# Patient Record
Sex: Female | Born: 1985 | Race: White | Hispanic: No | Marital: Married | Smoking: Never smoker
Health system: Southern US, Community
[De-identification: ages and names within clinical notes are randomized; demographics above are authoritative.]

## PROBLEM LIST (undated history)

## (undated) DIAGNOSIS — N858 Other specified noninflammatory disorders of uterus: Secondary | ICD-10-CM

## (undated) DIAGNOSIS — E785 Hyperlipidemia, unspecified: Secondary | ICD-10-CM

## (undated) DIAGNOSIS — T7840XA Allergy, unspecified, initial encounter: Secondary | ICD-10-CM

## (undated) HISTORY — DX: Hyperlipidemia, unspecified: E78.5

## (undated) HISTORY — DX: Allergy, unspecified, initial encounter: T78.40XA

## (undated) HISTORY — PX: OTHER SURGICAL HISTORY: SHX169

---

## 2006-01-10 ENCOUNTER — Ambulatory Visit: Payer: Self-pay | Admitting: Family Medicine

## 2006-01-24 ENCOUNTER — Other Ambulatory Visit: Admission: RE | Admit: 2006-01-24 | Discharge: 2006-01-24 | Payer: Self-pay | Admitting: Family Medicine

## 2006-01-24 ENCOUNTER — Ambulatory Visit: Payer: Self-pay | Admitting: Family Medicine

## 2006-12-07 ENCOUNTER — Ambulatory Visit: Payer: Self-pay | Admitting: Family Medicine

## 2007-05-04 ENCOUNTER — Ambulatory Visit: Payer: Self-pay | Admitting: Family Medicine

## 2007-05-04 ENCOUNTER — Other Ambulatory Visit: Admission: RE | Admit: 2007-05-04 | Discharge: 2007-05-04 | Payer: Self-pay | Admitting: Family Medicine

## 2008-01-04 ENCOUNTER — Ambulatory Visit: Payer: Self-pay | Admitting: Family Medicine

## 2008-11-07 ENCOUNTER — Ambulatory Visit: Payer: Self-pay | Admitting: Family Medicine

## 2009-01-01 ENCOUNTER — Ambulatory Visit: Payer: Self-pay | Admitting: Family Medicine

## 2009-04-02 ENCOUNTER — Ambulatory Visit: Payer: Self-pay | Admitting: Family Medicine

## 2009-04-22 ENCOUNTER — Ambulatory Visit: Payer: Self-pay | Admitting: Family Medicine

## 2009-04-24 ENCOUNTER — Encounter: Admission: RE | Admit: 2009-04-24 | Discharge: 2009-04-24 | Payer: Self-pay | Admitting: Family Medicine

## 2009-11-05 ENCOUNTER — Ambulatory Visit: Payer: Self-pay | Admitting: Family Medicine

## 2010-09-17 ENCOUNTER — Encounter: Payer: Self-pay | Admitting: Family Medicine

## 2010-11-20 IMAGING — CR DG CHEST 2V
2 series · 2 of 2 positions shown · non-contrast
Comparison: None

CLINICAL DATA: Elevated white blood cell count.

CHEST - 2 VIEW

[view not recorded (1 of 2)]
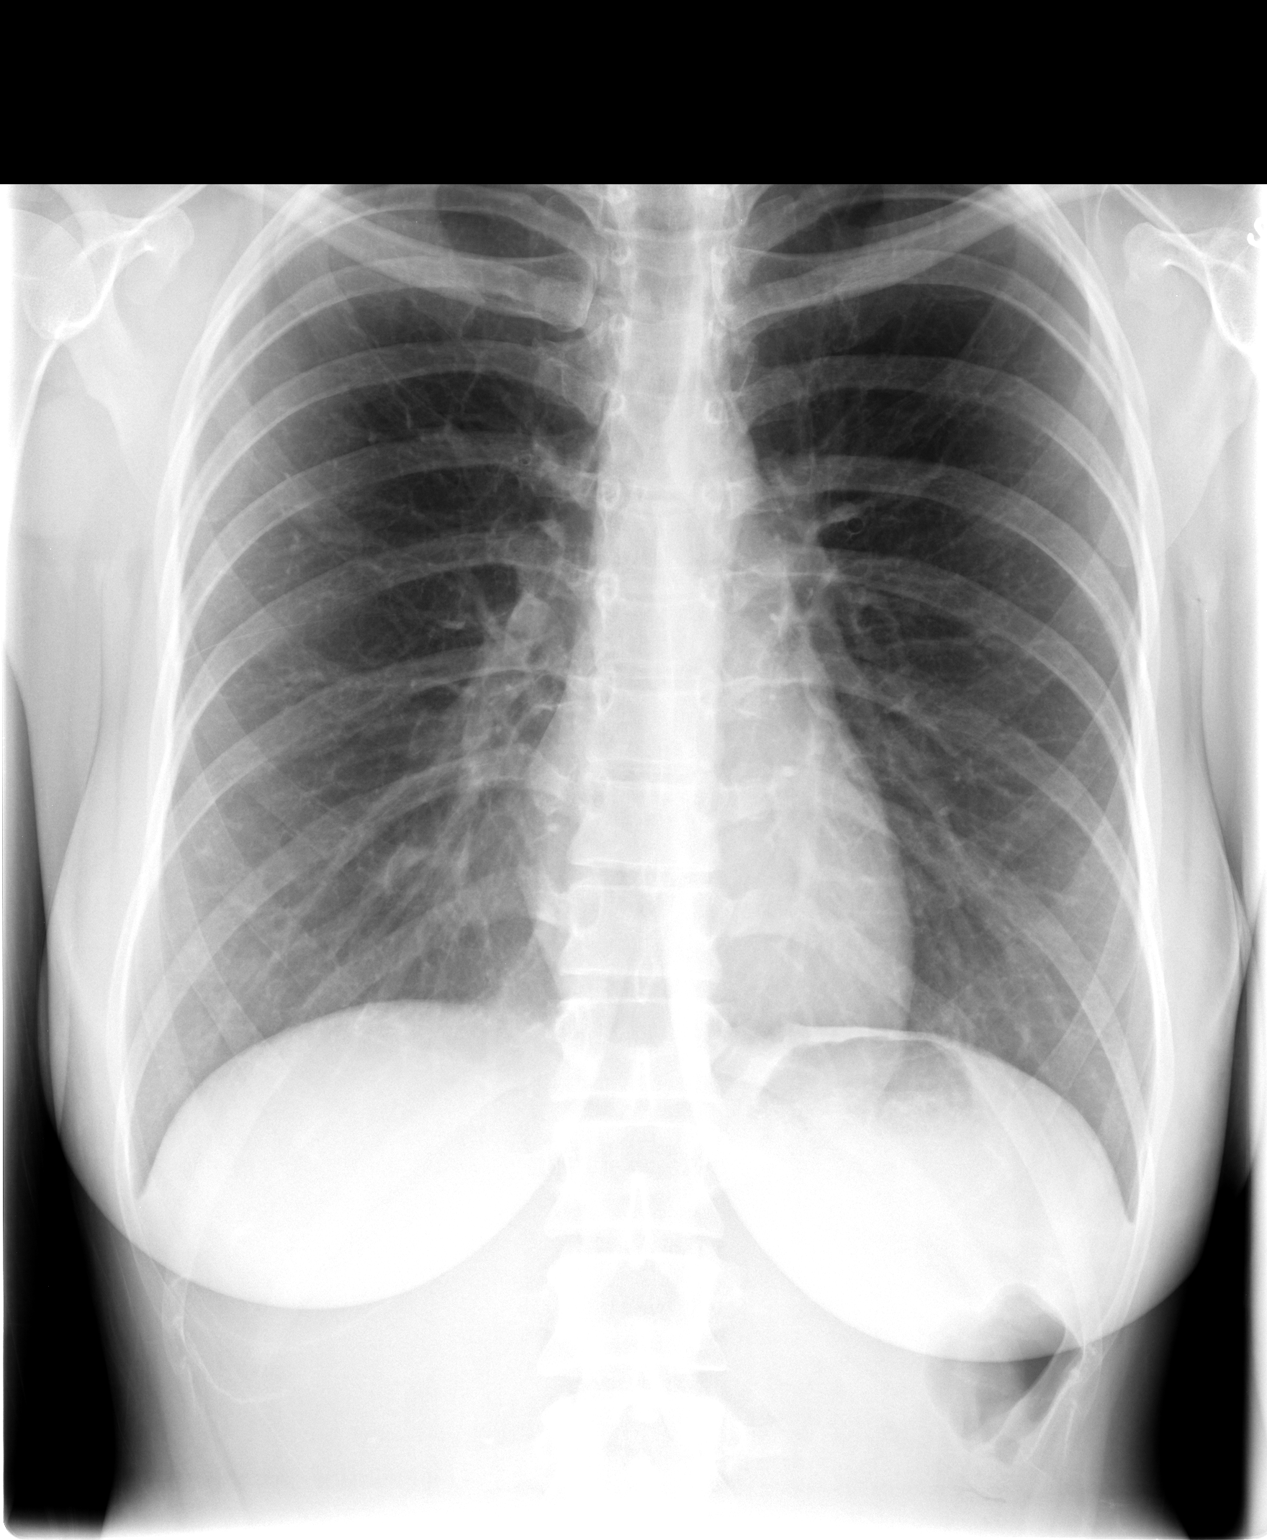

[view not recorded (2 of 2)]
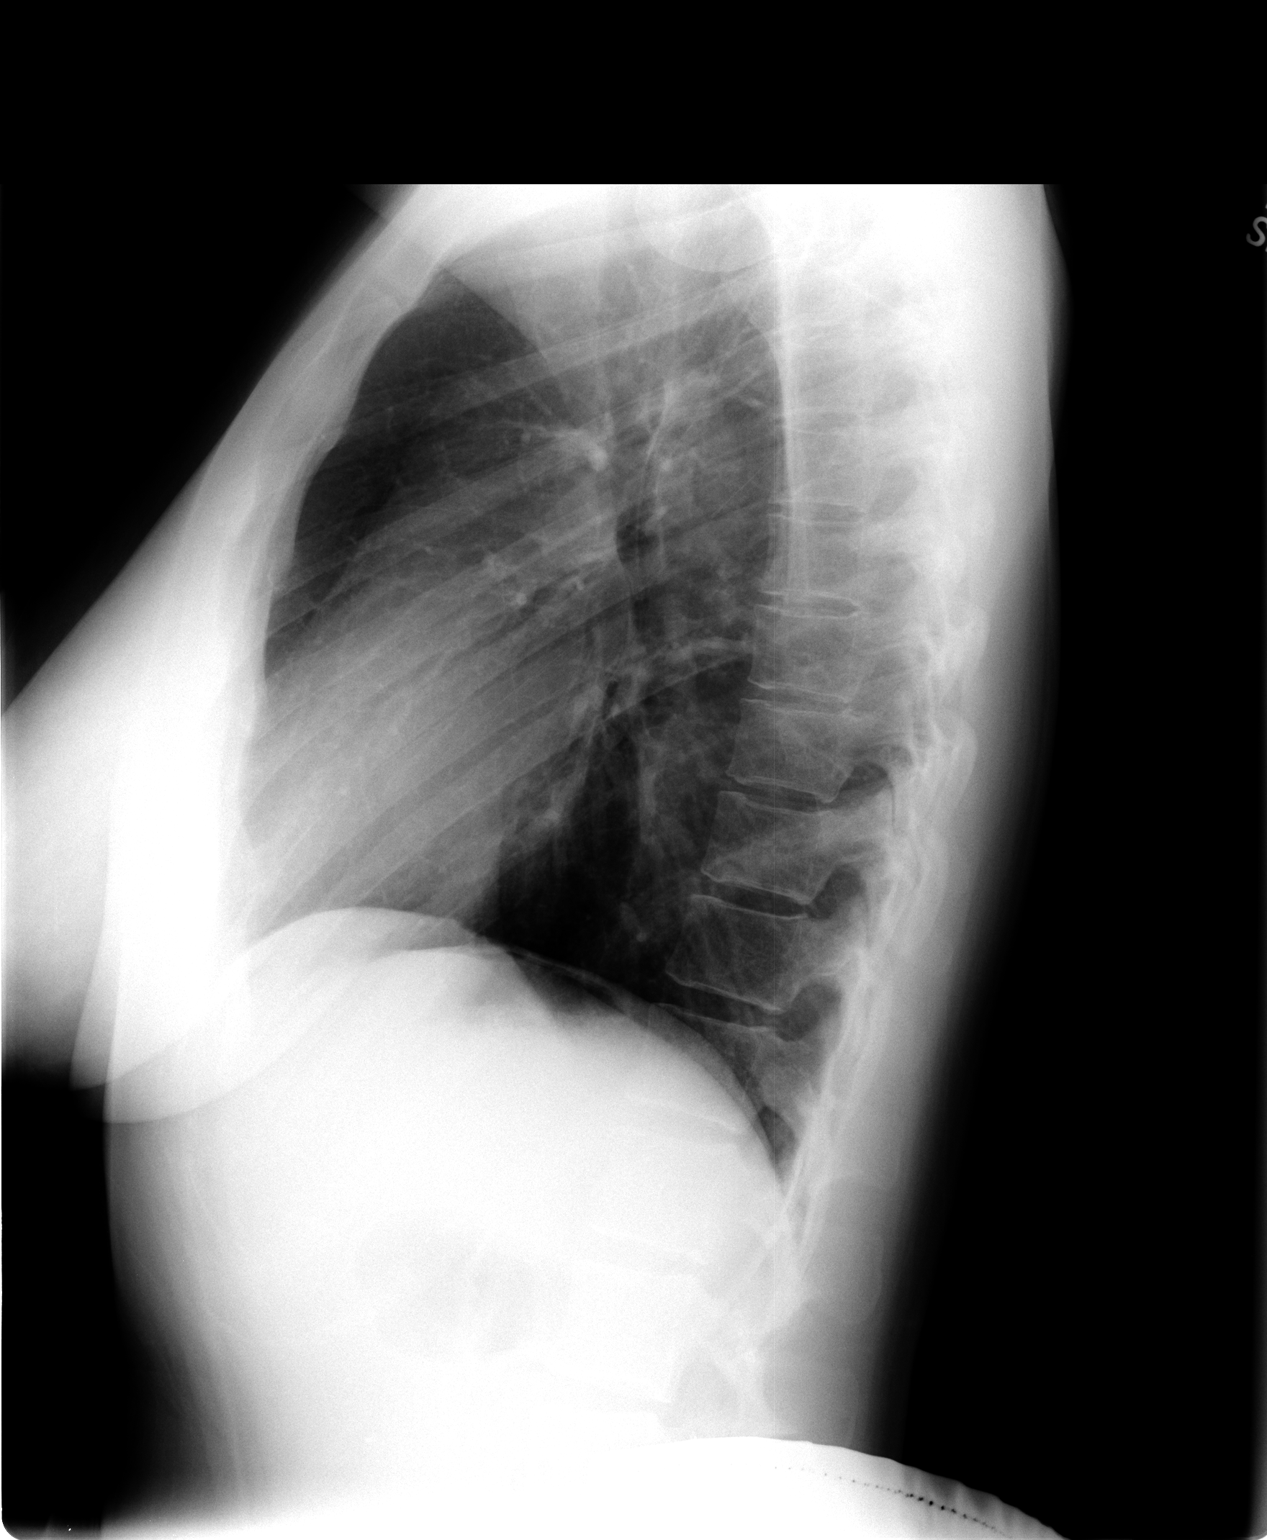

[2 of 2 positions shown; findings below may reference images not displayed]

FINDINGS: Heart and mediastinal contours are within normal limits.
No focal opacities or effusions.  No acute bony abnormality.
IMPRESSION: Normal exam.

## 2019-08-21 ENCOUNTER — Emergency Department (HOSPITAL_BASED_OUTPATIENT_CLINIC_OR_DEPARTMENT_OTHER)
Admission: EM | Admit: 2019-08-21 | Discharge: 2019-08-21 | Disposition: A | Discharge status: Home / Self Care | Payer: 59 | Attending: Emergency Medicine | Admitting: Emergency Medicine

## 2019-08-21 ENCOUNTER — Other Ambulatory Visit: Payer: Self-pay

## 2019-08-21 ENCOUNTER — Encounter (HOSPITAL_BASED_OUTPATIENT_CLINIC_OR_DEPARTMENT_OTHER): Payer: Self-pay

## 2019-08-21 DIAGNOSIS — Z20822 Contact with and (suspected) exposure to covid-19: Secondary | ICD-10-CM | POA: Diagnosis not present

## 2019-08-21 DIAGNOSIS — R1013 Epigastric pain: Secondary | ICD-10-CM | POA: Diagnosis present

## 2019-08-21 DIAGNOSIS — E162 Hypoglycemia, unspecified: Secondary | ICD-10-CM

## 2019-08-21 DIAGNOSIS — R7309 Other abnormal glucose: Secondary | ICD-10-CM | POA: Insufficient documentation

## 2019-08-21 HISTORY — DX: Other specified noninflammatory disorders of uterus: N85.8

## 2019-08-21 LAB — COMPREHENSIVE METABOLIC PANEL
ALT: 15 U/L (ref 0–44)
AST: 14 U/L — ABNORMAL LOW (ref 15–41)
Albumin: 3.9 g/dL (ref 3.5–5.0)
Alkaline Phosphatase: 64 U/L (ref 38–126)
Anion gap: 8 (ref 5–15)
BUN: 11 mg/dL (ref 6–20)
CO2: 26 mmol/L (ref 22–32)
Calcium: 8.7 mg/dL — ABNORMAL LOW (ref 8.9–10.3)
Chloride: 104 mmol/L (ref 98–111)
Creatinine, Ser: 0.46 mg/dL (ref 0.44–1.00)
GFR calc Af Amer: >60 mL/min (ref >60)
GFR calc non Af Amer: >60 mL/min (ref >60)
Glucose, Bld: 83 mg/dL (ref 70–99)
Potassium: 3.9 mmol/L (ref 3.5–5.1)
Sodium: 138 mmol/L (ref 135–145)
Total Bilirubin: 0.5 mg/dL (ref 0.3–1.2)
Total Protein: 6.5 g/dL (ref 6.5–8.1)

## 2019-08-21 LAB — CBC
HCT: 44.4 % (ref 36.0–46.0)
Hemoglobin: 14.9 g/dL (ref 12.0–15.0)
MCH: 31.6 pg (ref 26.0–34.0)
MCHC: 33.6 g/dL (ref 30.0–36.0)
MCV: 94.1 fL (ref 80.0–100.0)
Platelets: 293 10*3/uL (ref 150–400)
RBC: 4.72 MIL/uL (ref 3.87–5.11)
RDW: 12.3 % (ref 11.5–15.5)
WBC: 11.4 10*3/uL — ABNORMAL HIGH (ref 4.0–10.5)
nRBC: 0 % (ref 0.0–0.2)

## 2019-08-21 LAB — PREGNANCY, URINE: Preg Test, Ur: NEGATIVE

## 2019-08-21 LAB — CBG MONITORING, ED: Glucose-Capillary: 77 mg/dL (ref 70–99)

## 2019-08-21 LAB — URINALYSIS, ROUTINE W REFLEX MICROSCOPIC
Bilirubin Urine: NEGATIVE
Glucose, UA: 100 mg/dL — AB
Ketones, ur: NEGATIVE mg/dL
Leukocytes,Ua: NEGATIVE
Nitrite: NEGATIVE
Protein, ur: NEGATIVE mg/dL
Specific Gravity, Urine: <1.005 — ABNORMAL LOW (ref 1.005–1.030)
pH: 7 (ref 5.0–8.0)

## 2019-08-21 LAB — URINALYSIS, MICROSCOPIC (REFLEX)

## 2019-08-21 LAB — LIPASE, BLOOD: Lipase: 23 U/L (ref 11–51)

## 2019-08-21 LAB — SARS CORONAVIRUS 2 BY RT PCR (HOSPITAL ORDER, PERFORMED IN ~~LOC~~ HOSPITAL LAB): SARS Coronavirus 2: NEGATIVE

## 2019-08-21 MED ORDER — SODIUM CHLORIDE 0.9 % IV BOLUS
1000.0000 mL | Freq: Once | INTRAVENOUS | Status: AC
  Administered 2019-08-21: 1000 mL via INTRAVENOUS

## 2019-08-21 MED ORDER — POLYETHYLENE GLYCOL 3350 17 G PO PACK: 17.0000 g | PACK | Freq: Every day | ORAL | 0 refills | Status: AC

## 2019-08-21 MED ORDER — FAMOTIDINE 20 MG PO TABS: 20.0000 mg | ORAL_TABLET | Freq: Two times a day (BID) | ORAL | 0 refills | Status: AC

## 2019-08-21 MED ORDER — SODIUM CHLORIDE 0.9% FLUSH
3.0000 mL | Freq: Once | INTRAVENOUS | Status: DC
  Filled 2019-08-21: qty 3

## 2019-08-21 MED ORDER — ONDANSETRON HCL 4 MG/2ML IJ SOLN
4.0000 mg | Freq: Once | INTRAMUSCULAR | Status: AC
  Administered 2019-08-21: 4 mg via INTRAVENOUS
  Filled 2019-08-21: qty 2

## 2019-08-21 MED ORDER — ALUM & MAG HYDROXIDE-SIMETH 200-200-20 MG/5ML PO SUSP
30.0000 mL | Freq: Once | ORAL | Status: AC
  Administered 2019-08-21: 30 mL via ORAL
  Filled 2019-08-21: qty 30

## 2019-08-21 NOTE — ED Provider Notes (Signed)
[facility] EMERGENCY DEPARTMENT Provider Note   CSN: [id number] Arrival date & time: 08/21/19  1159     History Chief Complaint  Patient presents with   Abdominal Pain    Shannon Chase is a 34 y.o. female.  HPI   Patient is a 34 year old female with history of allergies, hyperlipidemia, uterine cyst, who presents to the emergency department today for evaluation of abdominal pain.  States that for the last 3 days she has not felt well.  She has had episodes of lightheadedness and shaky which she attributed to her blood sugars being low.  She checked her blood sugars at home and they were in the 50s to 60s.  She ate some food and symptoms ultimately improved.  She has had similar symptoms in the past.  States today she developed some epigastric abdominal discomfort and also has had some nausea.  She feels like she has been a bit constipated recently but has had a bowel movement in the last 1 to 2 days.  Denies any diarrhea.  Denies urinary symptoms, fevers, cough, chest pain, shortness of breath.  At this time her lightheadedness and shakiness has resolved.  Past Medical History:  Diagnosis Date   Allergy    Contraceptive management    Hyperlipemia    Uterine cyst     There are no problems to display for this patient.   Past Surgical History:  Procedure Laterality Date   uterine wall cyst removal       OB History   No obstetric history on file.     Family History  Problem Relation Age of Onset   Cancer Mother    Diabetes Mother    Mental illness Mother    Hypertension Mother    Tuberculosis Mother    Hypertension Paternal Uncle    Tuberculosis Paternal Uncle    Cancer Paternal Grandmother    Diabetes Paternal Grandmother    Heart disease Paternal Grandmother    Hypertension Paternal Grandmother    Diabetes Paternal Grandfather    Tuberculosis Father     Social History   Tobacco Use   Smoking status: Never Smoker    Smokeless tobacco: Never Used  Building services engineer Use: Never used  Substance Use Topics   Alcohol use: Not Currently   Drug use: Never    Home Medications Prior to Admission medications   Medication Sig Start Date End Date Taking? Authorizing Provider  drospirenone-ethinyl estradiol (YAZ) 3-0.02 MG per tablet Take 1 tablet by mouth daily.      [provider]  famotidine (PEPCID) 20 MG tablet Take 1 tablet (20 mg total) by mouth 2 (two) times daily for 14 days. 08/21/19 09/04/19  Vahan Wadsworth S, PA-C  polyethylene glycol (MIRALAX) 17 g packet Take 17 g by mouth daily. Dissolve one cap full in solution (water, gatorade, etc.) and administer once cap-full daily. You may titrate up daily by 1 cap-full until the patient is having pudding consistency of stools. After the patient is able to start passing softer stools they will need to be on 1/2 cap-full daily for 2 weeks. 08/21/19   Daton Szilagyi S, PA-C    Allergies    Penicillins  Review of Systems   Review of Systems  Constitutional: Negative for chills and fever.       Shakiness, feeling generally unwell  HENT: Negative for ear pain and sore throat.   Eyes: Negative for visual disturbance.  Respiratory: Negative for cough and shortness  of breath.   Cardiovascular: Negative for chest pain.  Gastrointestinal: Positive for abdominal pain, constipation and nausea. Negative for diarrhea and vomiting.  Genitourinary: Negative for dysuria and hematuria.  Musculoskeletal: Negative for back pain.  Skin: Negative for rash.  Neurological: Positive for light-headedness. Negative for headaches.  All other systems reviewed and are negative.   Physical Exam Updated Vital Signs BP 113/73 (BP Location: Left Arm)    Pulse (!) 54    Temp 98.2 F (36.8 C) (Oral)    Resp 13    Ht 5\' 6"  (1.676 m)    Wt 63.5 kg    SpO2 100%    BMI 22.60 kg/m   Physical Exam Vitals and nursing note reviewed.  Constitutional:      General: She is not  in acute distress.    Appearance: She is well-developed.  HENT:     Head: Normocephalic and atraumatic.  Eyes:     Conjunctiva/sclera: Conjunctivae normal.  Cardiovascular:     Rate and Rhythm: Normal rate and regular rhythm.     Heart sounds: Normal heart sounds. No murmur heard.   Pulmonary:     Effort: Pulmonary effort is normal. No respiratory distress.     Breath sounds: Normal breath sounds. No wheezing, rhonchi or rales.  Abdominal:     General: Bowel sounds are normal.     Palpations: Abdomen is soft.     Tenderness: There is no abdominal tenderness. There is no guarding or rebound.  Musculoskeletal:     Cervical back: Neck supple.  Skin:    General: Skin is warm and dry.  Neurological:     Mental Status: She is alert.     ED Results / Procedures / Treatments   Labs (all labs ordered are listed, but only abnormal results are displayed) Labs Reviewed  CBC - Abnormal; Notable for the following components:      Result Value   WBC 11.4 (*)    All other components within normal limits  URINALYSIS, ROUTINE W REFLEX MICROSCOPIC - Abnormal; Notable for the following components:   Specific Gravity, Urine <1.005 (*)    Glucose, UA 100 (*)    Hgb urine dipstick TRACE (*)    All other components within normal limits  COMPREHENSIVE METABOLIC PANEL - Abnormal; Notable for the following components:   Calcium 8.7 (*)    AST 14 (*)    All other components within normal limits  URINALYSIS, MICROSCOPIC (REFLEX) - Abnormal; Notable for the following components:   Bacteria, UA MANY (*)    All other components within normal limits  SARS CORONAVIRUS 2 BY RT PCR (HOSPITAL ORDER, PERFORMED IN  HOSPITAL LAB)  PREGNANCY, URINE  LIPASE, BLOOD  CBG MONITORING, ED    EKG None  Radiology No results found.  Procedures Procedures (including critical care time)  Medications Ordered in ED Medications  sodium chloride flush (NS) 0.9 % injection 3 mL (3 mLs Intravenous  Not Given 08/21/19 1319)  sodium chloride 0.9 % bolus 1,000 mL ( Intravenous Stopped 08/21/19 1528)  ondansetron (ZOFRAN) injection 4 mg (4 mg Intravenous Given 08/21/19 1424)  alum & mag hydroxide-simeth (MAALOX/MYLANTA) 200-200-20 MG/5ML suspension 30 mL (30 mLs Oral Given 08/21/19 1424)    ED Course  I have reviewed the triage vital signs and the nursing notes.  Pertinent labs & imaging results that were available during my care of the patient were reviewed by me and considered in my medical decision making (see chart for details).  MDM Rules/Calculators/A&P                          34 year old female presenting for evaluation of feeling unwell intermittently for the last few days.  Has had lightheadedness and a shaky feeling like she is hypoglycemic.  She is checked her blood sugars and they have been in the 50s and 60s twice when she is checked it.  Today she also noticed some epigastric pain, nausea.  She also feels like she is been constipated.  Vital signs are reassuring.  Abdomen is soft and nontender.  We will get labs, give IV fluids and GI cocktail.  CBG within normal limits CBC with mild leukocytosis, no anemia  -Patient states leukocytosis is chronic for her CMP with normal electrolytes, kidney function and for function Lipase is negative Pregnancy test is negative Covid test is negative UA does not show evidence of UTI  EKG shows normal sinus rhythm with no acute ischemic changes.  On reassessment patient states that she feels well.  We discussed the results of her work-up being reassuring and showing no emergent etiology of symptoms at this time.  Suspect that she may have intermittent hypoglycemia causing symptoms and discussed that she will need further work-up of this as an outpatient with her PCP.  She states she will make an appointment.  Have advised close monitoring of her symptoms and strict return precautions.  She voices understanding of the plan and reasons to  return.  All questions answered.  Patient stable for discharge.  Final Clinical Impression(s) / ED Diagnoses Final diagnoses:  Epigastric pain  Low blood sugar    Rx / DC Orders ED Discharge Orders         Ordered    famotidine (PEPCID) 20 MG tablet  2 times daily     Discontinue  Reprint     08/21/19 1638    polyethylene glycol (MIRALAX) 17 g packet  Daily     Discontinue  Reprint     08/21/19 [address] [place], PA-C 08/21/19 1714    Lucrezia Starch, MD 08/22/19 1029

## 2019-08-21 NOTE — ED Triage Notes (Addendum)
Pt c/o abd pain/epigastric x 2 days-worse after eating-"some constipation"-states she has not been feeling well with c/o "feeling light headed-feeling off like my sugar was dropping, shaky" sx started 3 days-states she checked her BS with family member"s glucometer with reading 57-67 today was 93-NAD-steady gait

## 2019-08-21 NOTE — Discharge Instructions (Signed)
Please continue to monitor your your blood sugars at home.  You were given a prescription for Pepcid to help with your possible acid reflux and also some MiraLAX to help with your constipation.  Please make an appoint with your regular doctor in the next week for reassessment and further investigation of your intermittent low blood sugars.  Please return to the emergency department for any new or worsening concerns in the meantime.
